# Patient Record
Sex: Male | Born: 1950 | Race: White | Hispanic: No | Marital: Married | Smoking: Never smoker
Health system: Southern US, Community
[De-identification: ages and names within clinical notes are randomized; demographics above are authoritative.]

## PROBLEM LIST (undated history)

## (undated) DIAGNOSIS — E119 Type 2 diabetes mellitus without complications: Secondary | ICD-10-CM

## (undated) HISTORY — PX: TONSILLECTOMY: SUR1361

## (undated) HISTORY — PX: BACK SURGERY: SHX140

## (undated) HISTORY — PX: KNEE SURGERY: SHX244

## (undated) HISTORY — PX: GALLBLADDER SURGERY: SHX652

## (undated) HISTORY — PX: CHOLECYSTECTOMY: SHX55

---

## 2018-02-08 ENCOUNTER — Ambulatory Visit (INDEPENDENT_AMBULATORY_CARE_PROVIDER_SITE_OTHER): Payer: Medicare Other | Admitting: Pediatrics

## 2018-02-08 ENCOUNTER — Encounter: Payer: Self-pay | Admitting: Pediatrics

## 2018-02-08 VITALS — BP 114/74 | HR 80 | Temp 98.0°F | Resp 18 | Ht 71.0 in | Wt 252.0 lb

## 2018-02-08 DIAGNOSIS — J4520 Mild intermittent asthma, uncomplicated: Secondary | ICD-10-CM | POA: Diagnosis not present

## 2018-02-08 DIAGNOSIS — I10 Essential (primary) hypertension: Secondary | ICD-10-CM | POA: Insufficient documentation

## 2018-02-08 DIAGNOSIS — J3089 Other allergic rhinitis: Secondary | ICD-10-CM | POA: Diagnosis not present

## 2018-02-08 DIAGNOSIS — K219 Gastro-esophageal reflux disease without esophagitis: Secondary | ICD-10-CM

## 2018-02-08 DIAGNOSIS — Z955 Presence of coronary angioplasty implant and graft: Secondary | ICD-10-CM | POA: Insufficient documentation

## 2018-02-08 DIAGNOSIS — J45909 Unspecified asthma, uncomplicated: Secondary | ICD-10-CM | POA: Insufficient documentation

## 2018-02-08 MED ORDER — ALBUTEROL SULFATE HFA 108 (90 BASE) MCG/ACT IN AERS: INHALATION_SPRAY | RESPIRATORY_TRACT | 1 refills | Status: AC

## 2018-02-08 MED ORDER — FLUTICASONE PROPIONATE 50 MCG/ACT NA SUSP: NASAL | 5 refills | Status: AC

## 2018-02-08 MED ORDER — CETIRIZINE HCL 10 MG PO TABS: ORAL_TABLET | ORAL | 5 refills | Status: AC

## 2018-02-08 MED ORDER — OMEPRAZOLE 40 MG PO CPDR: DELAYED_RELEASE_CAPSULE | ORAL | 5 refills | Status: AC

## 2018-02-08 NOTE — Progress Notes (Signed)
[address] [place] [id number] Dept: [phone]  New Patient Note  Patient ID: Aaron Monroe, male    DOB: 12/02/1950  Age: 67 y.o. MRN: [id number] Date of Office Visit: 02/08/2018 Referring provider: No referring provider defined for this encounter.    Chief Complaint: Allergies  HPI Aaron MillmanLarry Monroe presents for evaluation of nasal congestion and postnasal drainage since childhood.  He has aggravation of his symptoms on exposure to dust and  the spring pollens.  He has 2 or 3 episodes of  either allergic-like symptoms or sinus infections per year.  His postnasal drainage is clear.  With the episodes of postnasal drainage he has coughing spells.  He has a hiatal hernia.  He clears his throat frequently.  He used to have gastroesophageal reflux.  He had a coronary artery stent placed 2 years ago.  He has never been diagnosed with asthma eczema or chronic hives.  He broke his nose once and has  some difficulty breathing through the  left nostril .  He once had a rash from Celebrex but he can tolerate a baby aspirin once a day and meloxicam  Review of Systems  Constitutional: Negative.   HENT:       Sinus problems and postnasal drainage since childhood.  History of tonsillectomy and adenoidectomy  Eyes: Negative.   Respiratory:       Coughing spells from postnasal drainage 2 or 3 times per year  Cardiovascular:       Hypertension.  Coronary stent 2 years ago  Gastrointestinal:       Occasional heartburn.  Hiatal hernia.  Cholecystectomy  Genitourinary: Negative.   Musculoskeletal:       Osteoarthritis.  2 knee replacements  Skin: Negative.   Neurological:       Lumbar laminectomy in 1987  Endo/Heme/Allergies:       No diabetes or thyroid disease  Psychiatric/Behavioral: Negative.     Outpatient Encounter Medications as of 02/08/2018  Medication Sig  . Ascorbic Acid (VITAMIN C) 100 MG tablet Take 100 mg by mouth daily.  Marland Kitchen. aspirin EC 81 MG tablet Take 81 mg by mouth daily.    Marland Kitchen. atorvastatin (LIPITOR) 40 MG tablet Take 40 mg by mouth daily.  . clindamycin (CLEOCIN) 150 MG capsule Take 150 mg by mouth as needed.  . diphenhydrAMINE (BENADRYL) 25 MG tablet Take 25 mg by mouth every 6 (six) hours as needed.  Marland Kitchen. lisinopril (PRINIVIL,ZESTRIL) 5 MG tablet Take 5 mg by mouth daily.  . meloxicam (MOBIC) 15 MG tablet Take 15 mg by mouth daily.  . Multiple Vitamin (MULTI-VITAMINS) TABS Take by mouth.  . Potassium 95 MG TABS Take by mouth.  Marland Kitchen. albuterol (PROAIR HFA) 108 (90 Base) MCG/ACT inhaler 2 puffs every 4 hours if needed for wheezing or coughing spells  . cetirizine (ZYRTEC) 10 MG tablet Take 1 tablet once a day for runny nose or itchy eyes  . fluticasone (FLONASE) 50 MCG/ACT nasal spray 1 spray per nostril twice daily for stuffy nose  . omeprazole (PRILOSEC) 40 MG capsule Take 1 capsule once a day to prevent reflux and clenaing of the throat   No facility-administered encounter medications on file as of 02/08/2018.      Drug Allergies:  Allergies  Allergen Reactions  . Celecoxib Itching  . Penicillins Rash and Swelling  . Oxycodone-Acetaminophen Rash    Family History: Aaron Monroe's family history includes Arthritis in his daughter..  Family history is positive for asthma and emphysema.  Family history is  negative for hay fever, sinus problems , eczema, hives, food allergies.  His grandfather who had emphysema smoked cigarettes.  Social and environmental.  There are no pets in the home.  He is not exposed to cigarette smoking.  He has never smoked cigarettes in the past.  He is a Marine scientist.  His symptoms are not worse at work.  Physical Exam: BP 114/74   Pulse 80   Temp 98 F (36.7 C) (Oral)   Resp 18   Ht 5\' 11"  (1.803 m)   Wt 252 lb (114.3 kg)   SpO2 96%   BMI 35.15 kg/m    Physical Exam  Constitutional: He is oriented to person, place, and time. He appears well-developed and well-nourished.  HENT:  Eyes  normal.  Ears normal.  Nose mild swelling of  the nasal turbinates with a clear nasal discharge.  There was some mild deviation of the nasal septum to the left side.  There was some excoriation noted in the left nostril at the anterior nasal septum Pharynx normal  Neck: Neck supple. No thyromegaly present.  Cardiovascular:  S1-S2 normal no murmurs  Pulmonary/Chest:  Clear to percussion and auscultation  Abdominal: Soft. There is no tenderness.  No hepatosplenomegaly  Lymphadenopathy:    He has no cervical adenopathy.  Neurological: He is alert and oriented to person, place, and time.  Skin:  Clear  Psychiatric: He has a normal mood and affect. His behavior is normal. Judgment and thought content normal.  Vitals reviewed.   Diagnostics: FVC 4.41 L FEV1 3.45 L.  Predicted  FVC 4.61 L predicted FEV1 3.40 L.  After albuterol 2 puffs FVC 4.69 L FEV1 3.69 L the spirometry is in the normal range and there was no significant improvement after albuterol  Allergy skin test were positive to cedar pollen.  He had mild reactivity to some indoor molds   Assessment  Assessment and Plan: 1. Mild intermittent reactive airway disease without complication   2. Other allergic rhinitis   3. Essential hypertension   4. Gastroesophageal reflux disease without esophagitis   5. History of coronary angioplasty with insertion of stent     Meds ordered this encounter  Medications  . cetirizine (ZYRTEC) 10 MG tablet    Sig: Take 1 tablet once a day for runny nose or itchy eyes    Dispense:  30 tablet    Refill:  5  . fluticasone (FLONASE) 50 MCG/ACT nasal spray    Sig: 1 spray per nostril twice daily for stuffy nose    Dispense:  18.2 g    Refill:  5  . albuterol (PROAIR HFA) 108 (90 Base) MCG/ACT inhaler    Sig: 2 puffs every 4 hours if needed for wheezing or coughing spells    Dispense:  1 Inhaler    Refill:  1  . omeprazole (PRILOSEC) 40 MG capsule    Sig: Take 1 capsule once a day to prevent reflux and clenaing of the throat    Dispense:   30 capsule    Refill:  5    Patient Instructions  Environmental control of dust and mold Zyrtec 10 mg-take 1 tablet once a day for runny nose or itchy eyes Polysporin ointment twice a day in the left nostril for 1 week Next week start fluticasone 1 spray per nostril twice a day for stuffy nose.  Aim a little bit towards the eye on the same side Ipratropium 0.06% nasal spray-2 sprays per nostril at least 20 minutes  before eating .  You may use ipratropium nasal spray 3 times a day Pro-air 2 puffs every 4 hours if needed for wheezing or coughing spells Omeprazole 40 mg capsule-take 1 capsule once a day to prevent reflux and clearing of the throat Continue your other medications Call us if you are not doing well on this treatment   Return in about 6 weeks (around 03/22/2018).   Thank you for the opportunity to care for this patient.  Please do not hesitate to contact me with questions.  Tonette Bihari, M.D.  [facility] [address] [place], [place] [address] [phone]

## 2018-02-08 NOTE — Patient Instructions (Addendum)
Environmental control of dust and mold Zyrtec 10 mg-take 1 tablet once a day for runny nose or itchy eyes Polysporin ointment twice a day in the left nostril for 1 week Next week start fluticasone 1 spray per nostril twice a day for stuffy nose.  Aim a little bit towards the eye on the same side Ipratropium 0.06% nasal spray-2 sprays per nostril at least 20 minutes before eating .  You may use ipratropium nasal spray 3 times a day Pro-air 2 puffs every 4 hours if needed for wheezing or coughing spells Omeprazole 40 mg capsule-take 1 capsule once a day to prevent reflux and clearing of the throat Continue your other medications Call us if you are not doing well on this treatment

## 2018-03-22 ENCOUNTER — Ambulatory Visit: Payer: Medicare Other | Admitting: Pediatrics

## 2020-10-07 ENCOUNTER — Other Ambulatory Visit: Payer: Self-pay

## 2020-10-07 ENCOUNTER — Emergency Department (HOSPITAL_BASED_OUTPATIENT_CLINIC_OR_DEPARTMENT_OTHER)
Admission: EM | Admit: 2020-10-07 | Discharge: 2020-10-07 | Disposition: A | Discharge status: Home / Self Care | Payer: Medicare HMO | Attending: Emergency Medicine | Admitting: Emergency Medicine

## 2020-10-07 ENCOUNTER — Encounter (HOSPITAL_BASED_OUTPATIENT_CLINIC_OR_DEPARTMENT_OTHER): Payer: Self-pay | Admitting: Emergency Medicine

## 2020-10-07 ENCOUNTER — Emergency Department (HOSPITAL_BASED_OUTPATIENT_CLINIC_OR_DEPARTMENT_OTHER): Payer: Medicare HMO

## 2020-10-07 DIAGNOSIS — Z20822 Contact with and (suspected) exposure to covid-19: Secondary | ICD-10-CM | POA: Insufficient documentation

## 2020-10-07 DIAGNOSIS — Z79899 Other long term (current) drug therapy: Secondary | ICD-10-CM | POA: Diagnosis not present

## 2020-10-07 DIAGNOSIS — J0110 Acute frontal sinusitis, unspecified: Secondary | ICD-10-CM | POA: Insufficient documentation

## 2020-10-07 DIAGNOSIS — J3489 Other specified disorders of nose and nasal sinuses: Secondary | ICD-10-CM | POA: Diagnosis not present

## 2020-10-07 DIAGNOSIS — I10 Essential (primary) hypertension: Secondary | ICD-10-CM | POA: Diagnosis not present

## 2020-10-07 DIAGNOSIS — R059 Cough, unspecified: Secondary | ICD-10-CM | POA: Diagnosis present

## 2020-10-07 LAB — RESP PANEL BY RT-PCR (FLU A&B, COVID) ARPGX2
Influenza A by PCR: NEGATIVE
Influenza B by PCR: NEGATIVE
SARS Coronavirus 2 by RT PCR: NEGATIVE

## 2020-10-07 MED ORDER — BENZONATATE 100 MG PO CAPS: 100.0000 mg | ORAL_CAPSULE | Freq: Three times a day (TID) | ORAL | 0 refills | Status: AC

## 2020-10-07 MED ORDER — DOXYCYCLINE HYCLATE 100 MG PO CAPS: 100.0000 mg | ORAL_CAPSULE | Freq: Two times a day (BID) | ORAL | 0 refills | Status: AC

## 2020-10-07 NOTE — ED Triage Notes (Signed)
Pt arrives pov with driver, c/o productive cough x 3 days. Pt denies shob, or fever, ambulatory to room.

## 2020-10-07 NOTE — ED Provider Notes (Signed)
[facility] [facility] EMERGENCY DEPARTMENT Provider Note   CSN: [id number] Arrival date & time: 10/07/20  6381     History Chief Complaint  Patient presents with   Cough    Aaron Monroe is a 70 y.o. male.  The history is provided by the patient.  URI Presenting symptoms: congestion, cough and rhinorrhea   Presenting symptoms: no ear pain, no fever and no sore throat   Severity:  Moderate Onset quality:  Gradual Duration:  3 days Timing:  Constant Progression:  Unchanged Chronicity:  New Relieved by:  Nothing Worsened by:  Nothing Ineffective treatments:  OTC medications Associated symptoms: no arthralgias, no headaches and no neck pain   Risk factors comment:  Vaccinated x 2 for COVID     History reviewed. No pertinent past medical history.  Patient Active Problem List   Diagnosis Date Noted   History of coronary angioplasty with insertion of stent 02/08/2018   Gastroesophageal reflux disease without esophagitis 02/08/2018   Essential hypertension 02/08/2018   Other allergic rhinitis 02/08/2018   Reactive airway disease 02/08/2018    Past Surgical History:  Procedure Laterality Date   BACK SURGERY     CHOLECYSTECTOMY     GALLBLADDER SURGERY     KNEE SURGERY     TONSILLECTOMY         Family History  Problem Relation Age of Onset   Arthritis Daughter    Allergic rhinitis Neg Hx    Angioedema Neg Hx    Asthma Neg Hx    Eczema Neg Hx    Immunodeficiency Neg Hx    Urticaria Neg Hx    Rashes / Skin problems Neg Hx     Social History   Tobacco Use   Smoking status: Never   Smokeless tobacco: Never  Vaping Use   Vaping Use: Never used  Substance Use Topics   Alcohol use: Never   Drug use: Never    Home Medications Prior to Admission medications   Medication Sig Start Date End Date Taking? Authorizing Provider  albuterol (PROAIR HFA) 108 (90 Base) MCG/ACT inhaler 2 puffs every 4 hours if needed for wheezing or coughing spells 02/08/18    Fletcher Anon, MD  Ascorbic Acid (VITAMIN C) 100 MG tablet Take 100 mg by mouth daily.    [provider]  aspirin EC 81 MG tablet Take 81 mg by mouth daily.    [provider]  atorvastatin (LIPITOR) 40 MG tablet Take 40 mg by mouth daily. 10/18/15   [provider]  cetirizine (ZYRTEC) 10 MG tablet Take 1 tablet once a day for runny nose or itchy eyes 02/08/18   Fletcher Anon, MD  clindamycin (CLEOCIN) 150 MG capsule Take 150 mg by mouth as needed.    [provider]  diphenhydrAMINE (BENADRYL) 25 MG tablet Take 25 mg by mouth every 6 (six) hours as needed.    [provider]  fluticasone (FLONASE) 50 MCG/ACT nasal spray 1 spray per nostril twice daily for stuffy nose 02/08/18   Fletcher Anon, MD  lisinopril (PRINIVIL,ZESTRIL) 5 MG tablet Take 5 mg by mouth daily. 10/18/15   [provider]  meloxicam (MOBIC) 15 MG tablet Take 15 mg by mouth daily. 05/29/15   [provider]  Multiple Vitamin (MULTI-VITAMINS) TABS Take by mouth.    [provider]  omeprazole (PRILOSEC) 40 MG capsule Take 1 capsule once a day to prevent reflux and clenaing of the throat 02/08/18   Bardelas, Bonnita Hollow,  MD  Potassium 95 MG TABS Take by mouth.    [provider]    Allergies    Celecoxib, Penicillins, and Oxycodone-acetaminophen  Review of Systems   Review of Systems  Constitutional:  Negative for chills and fever.  HENT:  Positive for congestion and rhinorrhea. Negative for ear pain and sore throat.   Eyes:  Negative for pain and visual disturbance.  Respiratory:  Positive for cough. Negative for shortness of breath.   Cardiovascular:  Negative for chest pain and palpitations.  Gastrointestinal:  Negative for abdominal pain and vomiting.  Genitourinary:  Positive for frequency. Negative for dysuria and hematuria.  Musculoskeletal:  Negative for arthralgias, back pain and neck pain.  Skin:  Negative for color change and rash.   Neurological:  Negative for seizures, syncope and headaches.  All other systems reviewed and are negative.  Physical Exam Updated Vital Signs BP 132/78 (BP Location: Right Arm)   Pulse 100   Temp 98.6 F (37 C) (Oral)   Resp 18   Ht 6' (1.829 m)   Wt 107.5 kg   SpO2 97%   BMI 32.14 kg/m   Physical Exam Vitals and nursing note reviewed.  Constitutional:      Appearance: Normal appearance.  HENT:     Head: Normocephalic and atraumatic.  Cardiovascular:     Rate and Rhythm: Normal rate and regular rhythm.     Heart sounds: Normal heart sounds.  Pulmonary:     Effort: Pulmonary effort is normal.     Breath sounds: Normal breath sounds.  Musculoskeletal:     Cervical back: Normal range of motion.     Right lower leg: No edema.     Left lower leg: No edema.  Skin:    General: Skin is warm and dry.  Neurological:     General: No focal deficit present.     Mental Status: He is alert and oriented to person, place, and time.  Psychiatric:        Mood and Affect: Mood normal.        Behavior: Behavior normal.    ED Results / Procedures / Treatments   Labs (all labs ordered are listed, but only abnormal results are displayed) Labs Reviewed  RESP PANEL BY RT-PCR (FLU A&B, COVID) [id number]    EKG None  Radiology No results found.  Procedures Procedures   Medications Ordered in ED Medications - No data to display  ED Course  I have reviewed the triage vital signs and the nursing notes.  Pertinent labs & imaging results that were available during my care of the patient were reviewed by me and considered in my medical decision making (see chart for details).    MDM Rules/Calculators/A&P                           Nathanial Millman presented with a cough and congestion.  He is well-appearing in general.  He tested negative for COVID-19.  He will be placed on doxycycline for sinusitis.  Return precautions were discussed. Final Clinical Impression(s) / ED Diagnoses Final  diagnoses:  Acute non-recurrent frontal sinusitis    Rx / DC Orders ED Discharge Orders          Ordered    doxycycline (VIBRAMYCIN) 100 MG capsule  2 times daily        10/07/20 1101    benzonatate (TESSALON) 100 MG capsule  Every 8 hours  10/07/20 1101             Koleen Distance, MD 10/07/20 1102

## 2021-04-11 ENCOUNTER — Encounter (HOSPITAL_BASED_OUTPATIENT_CLINIC_OR_DEPARTMENT_OTHER): Payer: Self-pay | Admitting: *Deleted

## 2021-04-11 ENCOUNTER — Other Ambulatory Visit: Payer: Self-pay

## 2021-04-11 ENCOUNTER — Emergency Department (HOSPITAL_BASED_OUTPATIENT_CLINIC_OR_DEPARTMENT_OTHER)
Admission: EM | Admit: 2021-04-11 | Discharge: 2021-04-11 | Disposition: A | Discharge status: Home / Self Care | Payer: Medicare HMO | Attending: Emergency Medicine | Admitting: Emergency Medicine

## 2021-04-11 DIAGNOSIS — H6123 Impacted cerumen, bilateral: Secondary | ICD-10-CM | POA: Insufficient documentation

## 2021-04-11 DIAGNOSIS — Z7982 Long term (current) use of aspirin: Secondary | ICD-10-CM | POA: Diagnosis not present

## 2021-04-11 DIAGNOSIS — H938X3 Other specified disorders of ear, bilateral: Secondary | ICD-10-CM | POA: Diagnosis present

## 2021-04-11 HISTORY — DX: Type 2 diabetes mellitus without complications: E11.9

## 2021-04-11 MED ORDER — CARBAMIDE PEROXIDE 6.5 % OT SOLN: 5.0000 [drp] | Freq: Two times a day (BID) | OTIC | 0 refills | Status: AC

## 2021-04-11 MED ORDER — DOCUSATE SODIUM 50 MG/5ML PO LIQD
10.0000 mg | Freq: Once | ORAL | Status: AC
  Administered 2021-04-11: 10 mg via ORAL
  Filled 2021-04-11: qty 10

## 2021-04-11 NOTE — ED Triage Notes (Signed)
Pain in his ears. He feels he has wax build up.

## 2021-04-11 NOTE — ED Provider Notes (Signed)
[facility] EMERGENCY DEPARTMENT Provider Note   CSN: [id number] Arrival date & time: 04/11/21  1455     History  Chief Complaint  Patient presents with   Otalgia   Ear Fullness    Aaron Monroe is a 71 y.o. male who presents to the ED today with complaint of bilateral ear fullness, R > L for "awhile."  Patient states that he feels like his ears are full of wax.  He has tried earwax removal drops, candle, oil to soften up the wax without success.  He states he has an appointment with an audiologist on the 20th of this month however this morning his right ear completely closed up and he is having difficulty hearing out of it to ER visit today.  He states he has some mild pain in it due to the wax buildup.  He denies any fevers, chills, ear discharge.  The history is provided by the patient and medical records.      Home Medications Prior to Admission medications   Medication Sig Start Date End Date Taking? Authorizing Provider  carbamide peroxide (DEBROX) 6.5 % OTIC solution Place 5 drops into both ears 2 (two) times daily. 04/11/21  Yes Alabama Doig, PA-C  albuterol (PROAIR HFA) 108 (90 Base) MCG/ACT inhaler 2 puffs every 4 hours if needed for wheezing or coughing spells 02/08/18   Fletcher AnonBardelas, Jose A, MD  Ascorbic Acid (VITAMIN C) 100 MG tablet Take 100 mg by mouth daily.    [provider]  aspirin EC 81 MG tablet Take 81 mg by mouth daily.    [provider]  atorvastatin (LIPITOR) 40 MG tablet Take 40 mg by mouth daily. 10/18/15   [provider]  benzonatate (TESSALON) 100 MG capsule Take 1 capsule (100 mg total) by mouth every 8 (eight) hours. 10/07/20   Koleen DistanceWright, Anna G, MD  cetirizine (ZYRTEC) 10 MG tablet Take 1 tablet once a day for runny nose or itchy eyes 02/08/18   Fletcher AnonBardelas, Jose A, MD  clindamycin (CLEOCIN) 150 MG capsule Take 150 mg by mouth as needed.    [provider]  diphenhydrAMINE (BENADRYL) 25 MG tablet Take 25 mg by mouth  every 6 (six) hours as needed.    [provider]  fluticasone (FLONASE) 50 MCG/ACT nasal spray 1 spray per nostril twice daily for stuffy nose 02/08/18   Fletcher AnonBardelas, Jose A, MD  lisinopril (PRINIVIL,ZESTRIL) 5 MG tablet Take 5 mg by mouth daily. 10/18/15   [provider]  meloxicam (MOBIC) 15 MG tablet Take 15 mg by mouth daily. 05/29/15   [provider]  Multiple Vitamin (MULTI-VITAMINS) TABS Take by mouth.    [provider]  omeprazole (PRILOSEC) 40 MG capsule Take 1 capsule once a day to prevent reflux and clenaing of the throat 02/08/18   Fletcher AnonBardelas, Jose A, MD  Potassium 95 MG TABS Take by mouth.    [provider]      Allergies    Celecoxib, Penicillins, and Oxycodone-acetaminophen    Review of Systems   Review of Systems  Constitutional:  Negative for chills and fever.  HENT:  Positive for ear pain and hearing loss. Negative for ear discharge.   All other systems reviewed and are negative.  Physical Exam Updated Vital Signs BP (!) 139/96 (BP Location: Right Arm)    Pulse 85    Temp 98.2 F (36.8 C) (Oral)    Resp 18    Ht 6' (1.829 m)    Hartford FinancialWt  107.5 kg    SpO2 96%    BMI 32.14 kg/m  Physical Exam Vitals and nursing note reviewed.  Constitutional:      Appearance: He is not ill-appearing.  HENT:     Head: Normocephalic and atraumatic.     Right Ear: There is impacted cerumen.     Left Ear: There is impacted cerumen.     Ears:     Comments: Bilateral cerumen impaction Eyes:     Conjunctiva/sclera: Conjunctivae normal.  Cardiovascular:     Rate and Rhythm: Normal rate and regular rhythm.  Pulmonary:     Effort: Pulmonary effort is normal.     Breath sounds: Normal breath sounds.  Skin:    General: Skin is warm and dry.     Coloration: Skin is not jaundiced.  Neurological:     Mental Status: He is alert.    ED Results / Procedures / Treatments   Labs (all labs ordered are listed, but only abnormal results are displayed) Labs  Reviewed - No data to display  EKG None  Radiology No results found.  Procedures .Ear Cerumen Removal  Date/Time: 04/11/2021 4:29 PM Performed by: Tanda Rockers, PA-C Authorized by: Tanda Rockers, PA-C   Consent:    Consent obtained:  Verbal   Consent given by:  Patient   Risks discussed:  Bleeding, infection, pain, incomplete removal and TM perforation Universal protocol:    Patient identity confirmed:  Verbally with patient Procedure details:    Location:  R ear and L ear   Procedure type: curette     Procedure outcomes: cerumen removed   Post-procedure details:    Inspection:  No bleeding   Procedure completion:  Procedure terminated electively by provider Comments:     Ear wax removal attempted by nursing staff with irrigation; unsuccessful. I attempted removal via curette at bedside. Small amount of cerumen removed from R side however continues to be impacted. Cerumen appears to be very far in ear canal and difficult to remove via curette on both sides. Will attempt re-irrigation again.     Medications Ordered in ED Medications  docusate (COLACE) 50 MG/5ML liquid 10 mg (10 mg Oral Given 04/11/21 1551)    ED Course/ Medical Decision Making/ A&P                           Medical Decision Making 71 year old male who presents to the ED today with complaint of bilateral ear fullness and right-sided ear pain/muffled hearing.  On arrival to the ED today vitals are stable.  Patient appears to be no acute distress.  On exam he is noted to have bilateral cerumen impaction.  We will have nursing staff attempt earwax removal at this time.  Ear wax removal attempted x 3. Twice with irrigation/colace and once with a curette however continues to have significant impaction. Pt discharged at this time with Rx Debrox and ENT referral. Stable for discharge.   Problems Addressed: Bilateral impacted cerumen: acute illness or injury  Risk OTC drugs.          Final Clinical  Impression(s) / ED Diagnoses Final diagnoses:  Bilateral impacted cerumen    Rx / DC Orders ED Discharge Orders          Ordered    carbamide peroxide (DEBROX) 6.5 % OTIC solution  2 times daily        04/11/21 1717  Discharge Instructions      Please follow up with Ear Nose and Throat Dr. Suszanne Conners for further evaluation of your ear wax buildup.   Pick up the prescription ear drops and use as prescribed  Return to the ED for any new/worsening symptoms       Tanda Rockers, PA-C 04/11/21 1718    Derwood Kaplan, MD 04/13/21 1415

## 2021-04-11 NOTE — ED Notes (Signed)
ED Provider at bedside. 

## 2021-04-11 NOTE — Discharge Instructions (Addendum)
Please follow up with Ear Nose and Throat Dr. Suszanne Conners for further evaluation of your ear wax buildup.   Pick up the prescription ear drops and use as prescribed  Return to the ED for any new/worsening symptoms

## 2023-01-12 IMAGING — DX DG CHEST 1V PORT
1 series · 1 of 1 positions shown · non-contrast
Comparison: January 28, 2019

CLINICAL DATA: Evaluate cough.  Productive cough for 3 days.

EXAM:
PORTABLE CHEST 1 VIEW

[chest ap]
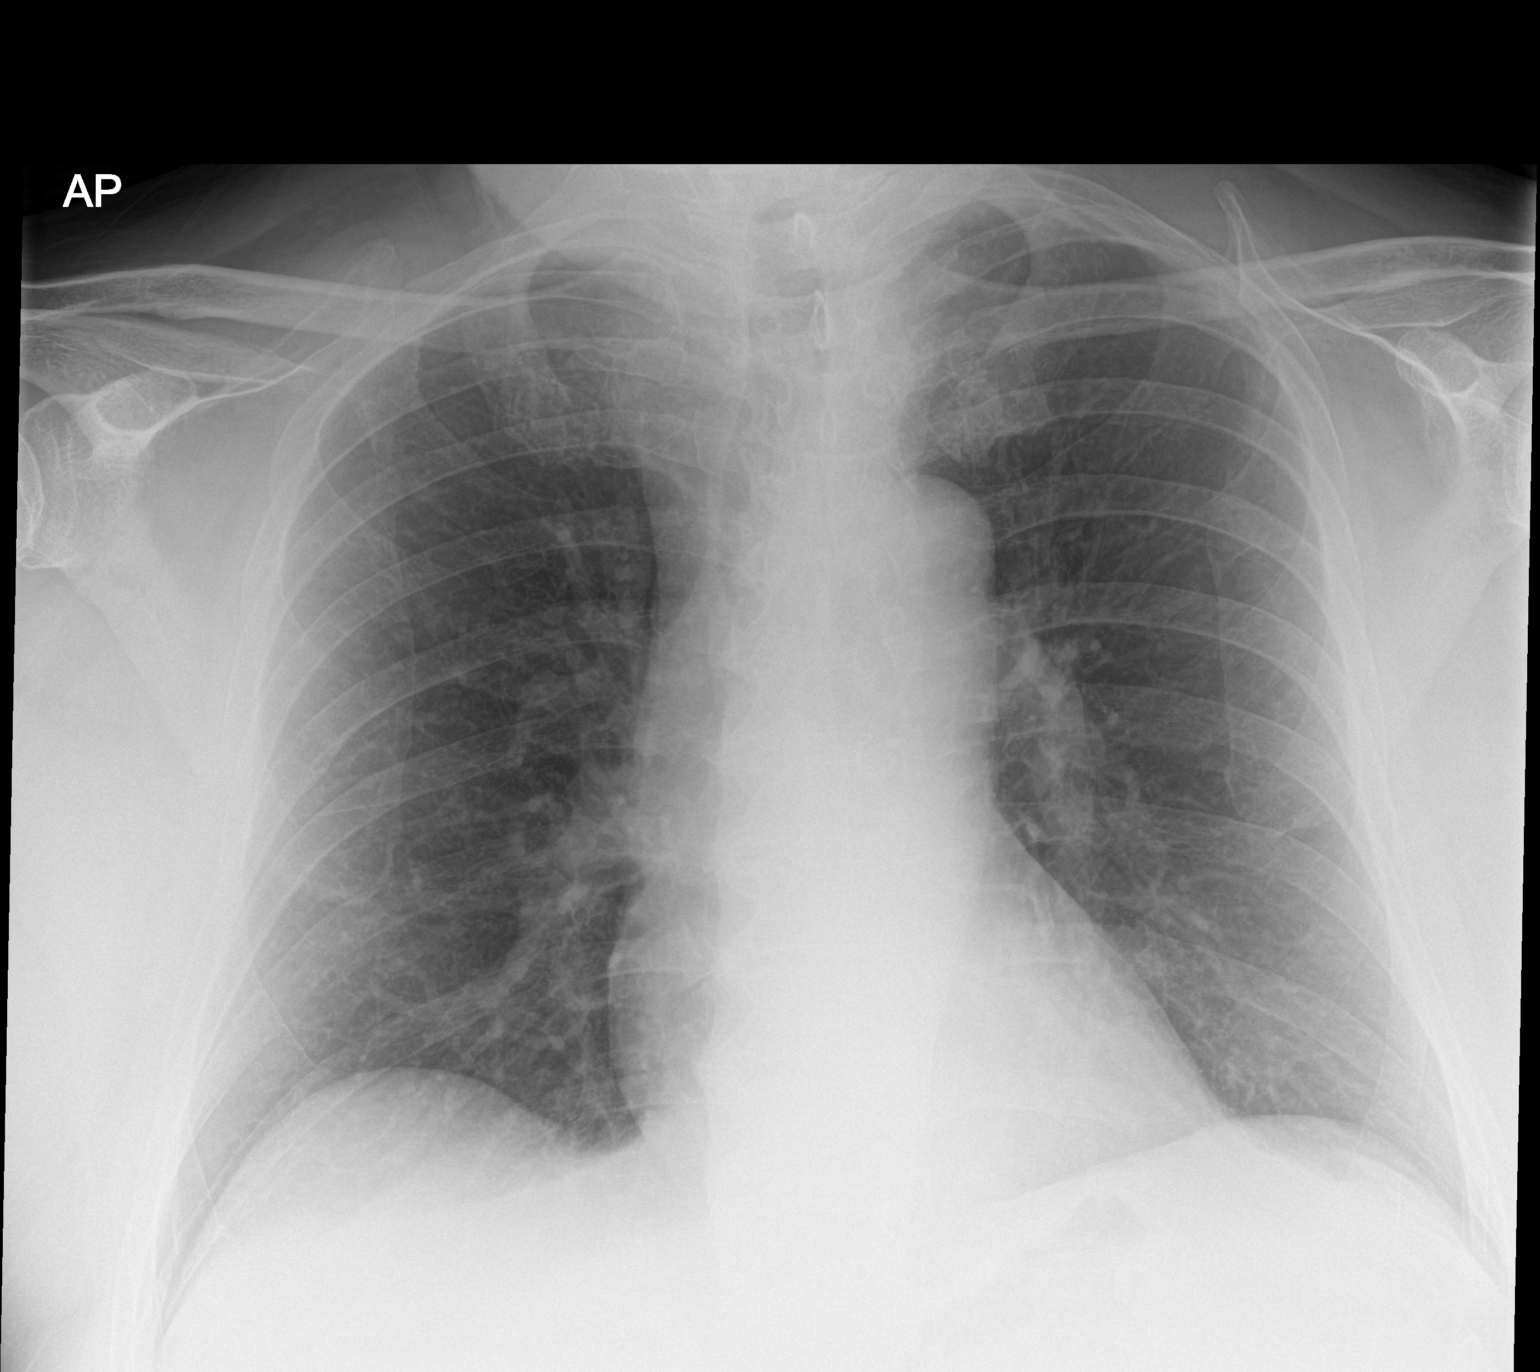

[1 of 1 positions shown; findings below may reference images not displayed]

FINDINGS: The heart size and mediastinal contours are within normal limits.
Both lungs are clear. The visualized skeletal structures are
unremarkable.
IMPRESSION: No active disease.

## 2023-04-14 ENCOUNTER — Emergency Department (HOSPITAL_BASED_OUTPATIENT_CLINIC_OR_DEPARTMENT_OTHER): Payer: PPO

## 2023-04-14 ENCOUNTER — Other Ambulatory Visit: Payer: Self-pay

## 2023-04-14 ENCOUNTER — Emergency Department (HOSPITAL_BASED_OUTPATIENT_CLINIC_OR_DEPARTMENT_OTHER)
Admission: EM | Admit: 2023-04-14 | Discharge: 2023-04-14 | Disposition: A | Discharge status: Home / Self Care | Payer: PPO | Attending: Emergency Medicine | Admitting: Emergency Medicine

## 2023-04-14 ENCOUNTER — Encounter (HOSPITAL_BASED_OUTPATIENT_CLINIC_OR_DEPARTMENT_OTHER): Payer: Self-pay | Admitting: Emergency Medicine

## 2023-04-14 DIAGNOSIS — N200 Calculus of kidney: Secondary | ICD-10-CM

## 2023-04-14 DIAGNOSIS — Z7982 Long term (current) use of aspirin: Secondary | ICD-10-CM | POA: Diagnosis not present

## 2023-04-14 DIAGNOSIS — N202 Calculus of kidney with calculus of ureter: Secondary | ICD-10-CM | POA: Insufficient documentation

## 2023-04-14 DIAGNOSIS — E119 Type 2 diabetes mellitus without complications: Secondary | ICD-10-CM | POA: Insufficient documentation

## 2023-04-14 DIAGNOSIS — R109 Unspecified abdominal pain: Secondary | ICD-10-CM | POA: Diagnosis present

## 2023-04-14 LAB — URINALYSIS, ROUTINE W REFLEX MICROSCOPIC
Bilirubin Urine: NEGATIVE
Glucose, UA: 250 mg/dL — AB
Ketones, ur: NEGATIVE mg/dL
Leukocytes,Ua: NEGATIVE
Nitrite: NEGATIVE
Protein, ur: NEGATIVE mg/dL
Specific Gravity, Urine: >=1.03 (ref 1.005–1.030)
pH: 5.5 (ref 5.0–8.0)

## 2023-04-14 LAB — BASIC METABOLIC PANEL
Anion gap: 8 (ref 5–15)
BUN: 16 mg/dL (ref 8–23)
CO2: 23 mmol/L (ref 22–32)
Calcium: 9.2 mg/dL (ref 8.9–10.3)
Chloride: 107 mmol/L (ref 98–111)
Creatinine, Ser: 1.14 mg/dL (ref 0.61–1.24)
GFR, Estimated: >60 mL/min (ref >60)
Glucose, Bld: 190 mg/dL — ABNORMAL HIGH (ref 70–99)
Potassium: 4.2 mmol/L (ref 3.5–5.1)
Sodium: 138 mmol/L (ref 135–145)

## 2023-04-14 LAB — URINALYSIS, MICROSCOPIC (REFLEX)
RBC / HPF: >50 RBC/hpf (ref 0–5)
WBC, UA: NONE SEEN WBC/hpf (ref 0–5)

## 2023-04-14 LAB — CBC
HCT: 43 % (ref 39.0–52.0)
Hemoglobin: 14.8 g/dL (ref 13.0–17.0)
MCH: 29.2 pg (ref 26.0–34.0)
MCHC: 34.4 g/dL (ref 30.0–36.0)
MCV: 84.8 fL (ref 80.0–100.0)
Platelets: 251 10*3/uL (ref 150–400)
RBC: 5.07 MIL/uL (ref 4.22–5.81)
RDW: 13.2 % (ref 11.5–15.5)
WBC: 8.5 10*3/uL (ref 4.0–10.5)
nRBC: 0 % (ref 0.0–0.2)

## 2023-04-14 MED ORDER — ONDANSETRON 4 MG PO TBDP: 4.0000 mg | ORAL_TABLET | Freq: Four times a day (QID) | ORAL | 0 refills | Status: AC | PRN

## 2023-04-14 MED ORDER — TAMSULOSIN HCL 0.4 MG PO CAPS
0.4000 mg | ORAL_CAPSULE | ORAL | Status: AC
  Administered 2023-04-14: 0.4 mg via ORAL
  Filled 2023-04-14: qty 1

## 2023-04-14 MED ORDER — SODIUM CHLORIDE 0.9 % IV BOLUS
500.0000 mL | Freq: Once | INTRAVENOUS | Status: AC
  Administered 2023-04-14: 500 mL via INTRAVENOUS

## 2023-04-14 MED ORDER — ONDANSETRON HCL 4 MG/2ML IJ SOLN
4.0000 mg | Freq: Once | INTRAMUSCULAR | Status: AC
  Administered 2023-04-14: 4 mg via INTRAVENOUS
  Filled 2023-04-14: qty 2

## 2023-04-14 MED ORDER — HYDROCODONE-ACETAMINOPHEN 5-325 MG PO TABS: 1.0000 | ORAL_TABLET | Freq: Four times a day (QID) | ORAL | 0 refills | Status: AC | PRN

## 2023-04-14 MED ORDER — HYDROMORPHONE HCL 1 MG/ML IJ SOLN
1.0000 mg | Freq: Once | INTRAMUSCULAR | Status: AC
  Administered 2023-04-14: 1 mg via INTRAVENOUS
  Filled 2023-04-14: qty 1

## 2023-04-14 NOTE — ED Triage Notes (Signed)
Pt via POV c/o right flank pain since around 0930am today with prior hx kidney stones. No new urinary symptoms. Pain currently rated 8/10 constant with nausea but no vomiting.

## 2023-04-14 NOTE — Discharge Instructions (Signed)
 It was a pleasure taking part in your care.  As we discussed, you have a 3 mm mid ureteral stone on the right.  Please continue taking Flomax  once a day 0.4 mg in the morning.  Please continue pushing fluids such as water.  Please strain your urine at home.  Take hydrocodone  pain medication every 6 hours as needed.  Please take Zofran  every 6 hours as needed for nausea.  Please follow-up with urology.  Return to the ED with any new or worsening symptoms such as inability to urinate, fevers.  Please be aware that pain medication will cause constipation so you may want to purchase MiraLAX to avoid this.

## 2023-04-14 NOTE — ED Provider Notes (Signed)
 [facility] Provider Note   CSN: [id number] Arrival date & time: 04/14/23  1207     History  Chief Complaint  Patient presents with   Flank Pain    Aaron Monroe is a 73 y.o. male with medical history of diabetes, kidney stones.  Patient presents to ED for evaluation of right-sided abdominal and flank pain.  Reports that this morning around 930 developed right-sided flank pain radiating to his abdomen.  Reports he is also having nausea without vomiting.  Denies diarrhea, fevers, chest pain or shortness of breath.  States he has a history of kidney stones, reports this feels very similar to past kidney stones.  Denies hematuria.  Unsure last time he saw urology.  Has not taken any medications at home.   Flank Pain Associated symptoms include abdominal pain. Pertinent negatives include no chest pain and no shortness of breath.       Home Medications Prior to Admission medications   Medication Sig Start Date End Date Taking? Authorizing Provider  albuterol  (PROAIR  HFA) 108 (90 Base) MCG/ACT inhaler 2 puffs every 4 hours if needed for wheezing or coughing spells 02/08/18   Asa Aloysius LABOR, MD  Ascorbic Acid (VITAMIN C) 100 MG tablet Take 100 mg by mouth daily.    [provider]  aspirin EC 81 MG tablet Take 81 mg by mouth daily.    [provider]  atorvastatin (LIPITOR) 40 MG tablet Take 40 mg by mouth daily. 10/18/15   [provider]  benzonatate  (TESSALON ) 100 MG capsule Take 1 capsule (100 mg total) by mouth every 8 (eight) hours. 10/07/20   Silvan Therisa MATSU, MD  carbamide peroxide (DEBROX) 6.5 % OTIC solution Place 5 drops into both ears 2 (two) times daily. 04/11/21   Venter, Margaux, PA-C  cetirizine  (ZYRTEC ) 10 MG tablet Take 1 tablet once a day for runny nose or itchy eyes 02/08/18   Asa Aloysius LABOR, MD  clindamycin (CLEOCIN) 150 MG capsule Take 150 mg by mouth as needed.    [provider]   diphenhydrAMINE (BENADRYL) 25 MG tablet Take 25 mg by mouth every 6 (six) hours as needed.    [provider]  fluticasone  (FLONASE ) 50 MCG/ACT nasal spray 1 spray per nostril twice daily for stuffy nose 02/08/18   Bardelas, Jose A, MD  HYDROcodone -acetaminophen  (NORCO/VICODIN) 5-325 MG tablet Take 1 tablet by mouth every 6 (six) hours as needed for severe pain (pain score 7-10). 04/14/23  Yes Ruthell Lonni FALCON, PA-C  lisinopril (PRINIVIL,ZESTRIL) 5 MG tablet Take 5 mg by mouth daily. 10/18/15   [provider]  meloxicam (MOBIC) 15 MG tablet Take 15 mg by mouth daily. 05/29/15   [provider]  Multiple Vitamin (MULTI-VITAMINS) TABS Take by mouth.    [provider]  omeprazole  (PRILOSEC) 40 MG capsule Take 1 capsule once a day to prevent reflux and clenaing of the throat 02/08/18   Asa Aloysius LABOR, MD  ondansetron  (ZOFRAN -ODT) 4 MG disintegrating tablet Take 1 tablet (4 mg total) by mouth every 6 (six) hours as needed for nausea or vomiting. 04/14/23  Yes Ruthell Lonni FALCON, PA-C  Potassium 95 MG TABS Take by mouth.    [provider]      Allergies    Celecoxib, Penicillins, and Oxycodone-acetaminophen     Review of Systems   Review of Systems  Constitutional:  Negative for fever.  Respiratory:  Negative for shortness of breath.   Cardiovascular:  Negative  for chest pain.  Gastrointestinal:  Positive for abdominal pain and nausea. Negative for diarrhea and vomiting.  Genitourinary:  Positive for flank pain. Negative for dysuria and hematuria.  All other systems reviewed and are negative.   Physical Exam Updated Vital Signs BP (!) 141/80   Pulse 81   Temp 98.2 F (36.8 C) (Oral)   Resp 15   Ht 6' (1.829 m)   Wt 105.7 kg   SpO2 99%   BMI 31.60 kg/m  Physical Exam Vitals and nursing note reviewed.  Constitutional:      General: He is not in acute distress.    Appearance: He is well-developed.  HENT:     Head: Normocephalic and  atraumatic.  Eyes:     Conjunctiva/sclera: Conjunctivae normal.  Cardiovascular:     Rate and Rhythm: Normal rate and regular rhythm.     Heart sounds: No murmur heard. Pulmonary:     Effort: Pulmonary effort is normal. No respiratory distress.     Breath sounds: Normal breath sounds.  Abdominal:     Palpations: Abdomen is soft.     Tenderness: There is no abdominal tenderness. There is right CVA tenderness. There is no left CVA tenderness.  Musculoskeletal:        General: No swelling.     Cervical back: Neck supple.  Skin:    General: Skin is warm and dry.     Capillary Refill: Capillary refill takes less than 2 seconds.  Neurological:     Mental Status: He is alert.  Psychiatric:        Mood and Affect: Mood normal.     ED Results / Procedures / Treatments   Labs (all labs ordered are listed, but only abnormal results are displayed) Labs Reviewed  URINALYSIS, ROUTINE W REFLEX MICROSCOPIC - Abnormal; Notable for the following components:      Result Value   APPearance HAZY (*)    Glucose, UA 250 (*)    Hgb urine dipstick LARGE (*)    All other components within normal limits  BASIC METABOLIC PANEL - Abnormal; Notable for the following components:   Glucose, Bld 190 (*)    All other components within normal limits  URINALYSIS, MICROSCOPIC (REFLEX) - Abnormal; Notable for the following components:   Bacteria, UA RARE (*)    All other components within normal limits  CBC    EKG None  Radiology CT Renal Stone Study Result Date: 04/14/2023 CLINICAL DATA:  Abdominal/flank pain, stone suspected EXAM: CT ABDOMEN AND PELVIS WITHOUT CONTRAST TECHNIQUE: Multidetector CT imaging of the abdomen and pelvis was performed following the standard protocol without IV contrast. RADIATION DOSE REDUCTION: This exam was performed according to the departmental dose-optimization program which includes automated exposure control, adjustment of the mA and/or kV according to patient size and/or  use of iterative reconstruction technique. COMPARISON:  01/19/2019 FINDINGS: Lower chest: No pleural or pericardial effusion. Visualized lung bases clear. Hepatobiliary: No focal liver abnormality is seen. Status post cholecystectomy. No biliary dilatation. Pancreas: Unremarkable. No pancreatic ductal dilatation or surrounding inflammatory changes. Spleen: Normal in size without focal abnormality. Adrenals/Urinary Tract: No adrenal mass. Scattered right nephrolithiasis largest 3 mm in the lower pole. 3 mm calculus in the mid right ureter (Im58,Se301) without significant hydronephrosis. Urinary bladder nondistended. Stomach/Bowel: Stomach is incompletely distended, without acute finding. Small bowel nondilated. Normal appendix. The colon is partially distended, without acute finding. Vascular/Lymphatic: Moderate scattered aortoiliac calcified plaque without aneurysm. No abdominal or pelvic adenopathy. Reproductive: Prostate is unremarkable.  Other: No ascites.  Right pelvic phleboliths.  No free air. Musculoskeletal: Mild spondylitic changes L2-S1. IMPRESSION: 1. 3 mm mid right ureteral calculus without significant hydronephrosis. 2. Right nephrolithiasis. Electronically Signed   By: JONETTA Faes M.D.   On: 04/14/2023 16:07    Procedures Procedures    Medications Ordered in ED Medications  HYDROmorphone  (DILAUDID ) injection 1 mg (1 mg Intravenous Given 04/14/23 1652)  ondansetron  (ZOFRAN ) injection 4 mg (4 mg Intravenous Given 04/14/23 1652)  sodium chloride  0.9 % bolus 500 mL (0 mLs Intravenous Stopped 04/14/23 1819)  tamsulosin  (FLOMAX ) capsule 0.4 mg (0.4 mg Oral Given 04/14/23 1656)    ED Course/ Medical Decision Making/ A&P Clinical Course as of 04/14/23 1830  Tue Apr 14, 2023  1818 3mm mid r ureteral calculus wo hydronephrosis [CG]    Clinical Course User Index [CG] Ruthell Lonni FALCON, PA-C   Medical Decision Making Amount and/or Complexity of Data Reviewed Labs: ordered. Radiology:  ordered.  Risk Prescription drug management.   73 year old male presents for evaluation.  Please see HPI for further details.  On examination patient is afebrile, nontachycardic.  His lung sounds are clear bilaterally, he is not hypoxic.  Abdomen soft and compressible throughout.  Patient does have right-sided CVA tenderness.  Neurological examinations at baseline.   Strong suspect kidney stone.  Will collect CT renal stone study, labs.  Patient provided 1 mg Dilaudid , 0.4 mg Flomax , 500 mL of fluid and Zofran  for symptomatic control.  Patient CBC without leukocytosis or anemia.  Metabolic panel without elevated creatinine, no electrolyte derangement.  Urinalysis shows large hemoglobin, or bacteria.  CT renal stone study shows 3 mm mid ureteral right-sided stone.  On reassessment patient reports pain controlled at this time.  Patient given urology follow-up, urine strainer, pain medication and Zofran .  Stable to discharge home.  Final Clinical Impression(s) / ED Diagnoses Final diagnoses:  Kidney stone    Rx / DC Orders ED Discharge Orders          Ordered    HYDROcodone -acetaminophen  (NORCO/VICODIN) 5-325 MG tablet  Every 6 hours PRN        04/14/23 1827    ondansetron  (ZOFRAN -ODT) 4 MG disintegrating tablet  Every 6 hours PRN        04/14/23 1827              Batu Cassin F, PA-C 04/14/23 1830    Jerral Meth, MD 04/15/23 1501

## 2023-05-02 ENCOUNTER — Other Ambulatory Visit (HOSPITAL_COMMUNITY): Payer: Self-pay
